# Patient Record
Sex: Male | Born: 2005 | Race: Black or African American | Hispanic: No | Marital: Single | Smoking: Never smoker
Health system: Southern US, Community
[De-identification: ages and names within clinical notes are randomized; demographics above are authoritative.]

## PROBLEM LIST (undated history)

## (undated) DIAGNOSIS — R7303 Prediabetes: Secondary | ICD-10-CM

## (undated) HISTORY — DX: Prediabetes: R73.03

---
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
## 2024-03-19 ENCOUNTER — Ambulatory Visit: Admitting: Physician Assistant

## 2024-03-19 ENCOUNTER — Encounter: Payer: Self-pay | Admitting: Physician Assistant

## 2024-03-19 VITALS — BP 137/90 | HR 114 | Temp 97.6°F | Resp 18 | Ht 75.25 in | Wt 370.0 lb

## 2024-03-19 DIAGNOSIS — R7303 Prediabetes: Secondary | ICD-10-CM

## 2024-03-19 DIAGNOSIS — Z23 Encounter for immunization: Secondary | ICD-10-CM

## 2024-03-19 DIAGNOSIS — Z6841 Body Mass Index (BMI) 40.0 and over, adult: Secondary | ICD-10-CM | POA: Diagnosis not present

## 2024-03-19 DIAGNOSIS — R61 Generalized hyperhidrosis: Secondary | ICD-10-CM

## 2024-03-19 DIAGNOSIS — I10 Essential (primary) hypertension: Secondary | ICD-10-CM | POA: Diagnosis not present

## 2024-03-19 MED ORDER — AMLODIPINE BESYLATE 2.5 MG PO TABS: 2.5000 mg | ORAL_TABLET | Freq: Every day | ORAL | 3 refills | Status: AC

## 2024-03-19 NOTE — Assessment & Plan Note (Addendum)
-   Weight gain to 370 lbs, considerable gain reported in the past couple of years.  - Advised weight loss through healthy diet and exercise. - Discussed benefits of weight loss on joint pain, hypertension, and overall health. - Screening labs obtained.

## 2024-03-19 NOTE — Assessment & Plan Note (Signed)
-   Expectant management - Can consider topical glycopyrrolate such as Qbrexza in the future if it becomes increasingly problematic, however it would be off-label and risk blurred vision and other side effects due to anticholinergic effects

## 2024-03-19 NOTE — Patient Instructions (Addendum)
" °  VISIT SUMMARY: Today, you came in to discuss your excessive sweating and to establish care. We also reviewed your history of hypertension, asthma, prediabetes, and recent weight gain. You were accompanied by your grandmother, Juanita.  YOUR PLAN: -ESSENTIAL HYPERTENSION: Essential hypertension means you have high blood pressure. It is important to manage this to prevent damage to your kidneys and other organs. We started you on amlodipine  2.5 mg daily and advised you to monitor your blood pressure at home. We also discussed lifestyle changes such as a low sodium diet and weight loss. We will recheck your blood pressure in two months and have ordered some lab tests to monitor your health.  -OBESITY: Obesity means having excess body weight. This can affect your joints, blood pressure, and overall health. We discussed the importance of weight loss through diet and exercise to improve your health and reduce joint pain and hypertension.  -PREDIABETES: Prediabetes means your blood sugar levels are higher than normal but not high enough to be classified as diabetes. We discussed monitoring your blood sugar levels and making dietary changes to prevent the progression to diabetes. We have ordered an A1c test to assess your current status.  -NEED FOR INFLUENZA VACCINATION: You were due for your influenza vaccine, which helps protect you from the flu. We administered the influenza vaccine today.  -NEED FOR MENINGOCOCCAL VACCINATION: You were due for the meningococcal B vaccine, which helps protect against meningitis. We administered the meningococcal B vaccine today.  INSTRUCTIONS: Please monitor your blood pressure at home and follow the lifestyle changes we discussed. We will recheck your blood pressure in two months. Additionally, please complete the lab tests we ordered: lipid profile, A1c, CBC, and thyroid  function tests.    Contains text generated by [employer].   "

## 2024-03-19 NOTE — Assessment & Plan Note (Addendum)
-   Newly diagnosed hypertension with BP 176/85 mmHg, likely exacerbated by anxiety.  - Re-check at the end of the visit showed improvement to 137/90 mmHg. - Extensive family history reported. Discussed the risks of poorly-controlled hypertension. - Advised home BP monitoring. - Discussed lifestyle modifications: low sodium diet, weight loss. - Will start amlodipine  2.5 mg daily and follow-up in 6 weeks. - Ordered screening labs: lipid profile, A1c, CBC, thyroid  function tests.

## 2024-03-19 NOTE — Progress Notes (Signed)
 "  Complete physical exam  Patient: Jacob Torres   DOB: [birth year]   18 y.o. Male  MRN: [id number]  Subjective:    Chief Complaint  Patient presents with   Establish Care   Excessive Sweating    Jacob Torres is a 19 y.o. male who presents today for a complete physical exam. He reports consuming a general diet.   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  History of Present Illness Jacob Torres is a 19 year old male with hyperhidrosis and hypertension who presents for sweating and to establish care. He is accompanied by his grandmother, Juanita.  Excessive sweating - Particularly when exposed to heat or after physical activity - Sweating primarily affects the face - Persistent for the past couple of years  Hypertension - Elevated blood pressure first noted during a dental visit, which prevented dental cleaning - No prior antihypertensive medication use - Family history of hypertension in mother and grandfather  Asthma - Uses albuterol  inhaler as needed, primarily during extreme physical activity when younger - Inhaler use is rare, less than once monthly  Prediabetes - Prediabetes identified by pediatrician - Family history of diabetes in grandmother  Postoperative status - acl repair - History of ACL repair in 2023 - No ongoing issues related to surgery  Weight gain and physical activity - Reports considerable weight gain since starting college - Attributed to decreased physical activity after stopping football     Most recent fall risk assessment:    03/19/2024    3:19 PM   Fall Risk   Falls in the past year? 0  Number falls in past yr: 0  Injury with Fall? 0  Risk for fall due to : No Fall Risks  Follow up Falls evaluation completed     Most recent depression screenings:    03/19/2024    3:19 PM  PHQ 2/9 Scores  PHQ - 2 Score 0            Patient Care Team: Landy Honora LITTIE DEVONNA as PCP - General (Physician Assistant)   Show/hide medication list[1]  ROS See HPI, all else negative.       Objective:     BP (!) 137/90   Pulse (!) 114   Temp 97.6 F (36.4 C)   Resp 18   Ht 6' 3.25 (1.911 m)   Wt (!) 370 lb (167.8 kg)   SpO2 100%   BMI 45.94 kg/m    Physical Exam Constitutional:      Appearance: Normal appearance.  HENT:     Head: Normocephalic and atraumatic.     Right Ear: Tympanic membrane normal.     Left Ear: Tympanic membrane normal.     Mouth/Throat:     Pharynx: Oropharynx is clear.  Eyes:     Extraocular Movements: Extraocular movements intact.     Pupils: Pupils are equal, round, and reactive to light.  Cardiovascular:     Rate and Rhythm: Normal rate and regular rhythm.     Pulses: Normal pulses.  Pulmonary:     Effort: Pulmonary effort is normal. No respiratory distress.     Breath sounds:  Normal breath sounds.  Abdominal:     General: There is no distension.     Palpations: Abdomen is soft.     Tenderness: There is no abdominal tenderness.  Genitourinary:    Comments: Deferred Musculoskeletal:        General: Normal range of motion.     Cervical back: Normal range of motion.  Skin:    General: Skin is warm.  Neurological:     General: No focal deficit present.     Mental Status: He is alert and oriented to person, place, and time.  Psychiatric:        Behavior: Behavior normal.      No results found for any visits on 03/19/24.     Assessment & Plan:    Routine Health Maintenance and Physical Exam Discussed health promotion and safety including diet and exercise recommendations,  dental health, and injury prevention. Tobacco cessation if applicable. Seat belts, sunscreen, smoke detectors, etc.    Immunization History  Administered Date(s) Administered   Dtap, Unspecified 08/19/2004, 10/22/2004, 12/27/2004, 09/13/2005, 06/11/2008   HIB, Unspecified 08/19/2004, 10/24/2004, 12/27/2004, 09/13/2005   HPV 9-valent 09/05/2021, 01/06/2022, 05/22/2022   Hep A, Unspecified 09/20/2005, 08/30/2006   Hep B, Unspecified 08/19/2004, 10/24/2004, 12/27/2004   Influenza, Seasonal, Injecte, Preservative Fre 01/18/2009, 12/11/2011, 03/19/2024   Influenza,inj,Quad PF,6+ Mos 03/06/2014, 11/07/2021   Influenza-Unspecified 03/28/2005, 03/06/2006, 03/23/2008   MMR 09/20/2005, 06/11/2008   MenQuadfi_Meningococcal Groups ACYW Conjugate 09/05/2021   Meningococcal B, OMV 03/19/2024   Meningococcal Mcv4o 10/12/2015   PFIZER(Purple Top)SARS-COV-2 Vaccination 07/18/2019, 08/08/2019   Pneumococcal-Unspecified 08/19/2004, 10/24/2004, 12/27/2004, 09/20/2005   Polio, Unspecified 08/19/2004, 10/24/2004, 12/27/2004, 06/11/2008   Tdap 10/12/2015   Varicella 03/06/2006, 06/11/2008    Health Maintenance  Topic Date Due   HIV Screening  Never done   Hepatitis C Screening  Never done   COVID-19 Vaccine (4 - 2025-26 season) 10/29/2023   Meningococcal B Vaccine (2 of 2 - Bexsero SCDM 2-dose series) 09/16/2024   DTaP/Tdap/Td (7 - Td or Tdap) 10/11/2025   Influenza Vaccine  Completed   HPV VACCINES  Completed   Pneumococcal Vaccine  Aged Out        Problem List Items Addressed This Visit       Cardiovascular and Mediastinum   Essential hypertension, benign   - Newly diagnosed hypertension with BP 176/85 mmHg, likely exacerbated by anxiety.  - Re-check at the end of the visit showed improvement to 137/90 mmHg. - Extensive family history reported. Discussed the risks of poorly-controlled hypertension. - Advised home BP monitoring. - Discussed lifestyle modifications: low sodium diet, weight  loss. - Will start amlodipine  2.5 mg daily and follow-up in 6 weeks. - Ordered screening labs: lipid profile, A1c, CBC, thyroid  function tests.      Relevant Medications   amLODipine  (NORVASC ) 2.5 MG tablet   Other Relevant Orders   CBC with Differential/Platelet   Comprehensive metabolic panel with GFR   Hemoglobin A1c   Lipid panel   TSH     Musculoskeletal and Integument   Craniofacial hyperhidrosis   - Expectant management - Can consider topical glycopyrrolate such as Qbrexza in the future if it becomes increasingly problematic, however it would be off-label and risk blurred vision and other side effects due to anticholinergic effects        Other   Prediabetes   - Discussed monitoring and lifestyle changes to prevent diabetes progression. - Ordered A1c test to assess status. - Advised dietary modifications for blood sugar management.  Morbid obesity (HCC)   - Weight gain to 370 lbs, considerable gain reported in the past couple of years.  - Advised weight loss through healthy diet and exercise. - Discussed benefits of weight loss on joint pain, hypertension, and overall health. - Screening labs obtained.      Other Visit Diagnoses       Need for influenza vaccination    -  Primary   Relevant Orders   Flu vaccine trivalent PF, 6mos and older(Flulaval,Afluria,Fluarix,Fluzone) (Completed)     Need for meningococcal vaccination       Relevant Orders   Meningococcal B, OMV (Bexsero) (Completed)       Annual preventative health maintenance - Administered influenza vaccine and meningococcal B vaccine as part of routine health maintenance.   Diet: Recommend to adjust caloric intake to maintain or achieve ideal body weight, to reduce intake of dietary saturated fat and total fat, to limit sodium intake by avoiding high sodium foods and not adding table salt, and to maintain adequate dietary potassium and calcium preferably from fresh fruits, vegetables, and low-fat  dairy products.   Emphasized the importance of regular exercise.  Injury prevention: Recommend seatbelts, safety helmets, smoke detector, etc..   Dental health: Recommend regular tooth brushing, flossing, and dental visits.    Return in about 6 weeks (around 04/30/2024) for Follow-Up.     Honora Seip, PA-C     [1]  Outpatient Medications Prior to Visit  Medication Sig   albuterol  (PROVENTIL  HFA;VENTOLIN  HFA) 108 (90 BASE) MCG/ACT inhaler Inhale 2 puffs into the lungs every 6 (six) hours as needed for wheezing.   [DISCONTINUED] albuterol  (PROVENTIL ) (2.5 MG/3ML) 0.083% nebulizer solution Take 2.5 mg by nebulization every 6 (six) hours as needed for wheezing.   [DISCONTINUED] albuterol  (VENTOLIN  HFA) 108 (90 Base) MCG/ACT inhaler Inhale 2 puffs into the lungs every 4 - 6 hours as needed for breathing diificulties   [DISCONTINUED] beclomethasone (QVAR) 80 MCG/ACT inhaler Inhale into the lungs 2 (two) times daily.   [DISCONTINUED] ibuprofen (ADVIL,MOTRIN) 100 MG/5ML suspension Take 5 mg/kg by mouth every 6 (six) hours as needed for fever.   [DISCONTINUED] loratadine (CLARITIN) 5 MG chewable tablet Chew 5 mg by mouth daily.   [DISCONTINUED] montelukast (SINGULAIR) 5 MG chewable tablet Chew 5 mg by mouth at bedtime.     [DISCONTINUED] ondansetron  (ZOFRAN  ODT) 4 MG disintegrating tablet 1 tab sublingual q6h PRN nausea (Patient not taking: Reported on 12/06/2016)   [DISCONTINUED] oxyCODONE -acetaminophen  (PERCOCET/ROXICET) 5-325 MG tablet Take 1 tablet by mouth every 4 (four) hours as needed for up to 7 days for Pain.   [DISCONTINUED] pregabalin  (LYRICA ) 50 MG capsule Take 1 capsule (50 mg total) by mouth 2 times daily for 14 days.   [DISCONTINUED] ranitidine  (ZANTAC ) 150 MG tablet Take 1 tablet (150 mg total) by mouth 2 (two) times daily.   [DISCONTINUED] tiZANidine  (ZANAFLEX ) 2 MG tablet Take 1 tablet (2 mg total) by mouth every 8 (eight) hours as needed for up to 14 days.   No  facility-administered medications prior to visit.   "

## 2024-03-19 NOTE — Assessment & Plan Note (Signed)
-   Discussed monitoring and lifestyle changes to prevent diabetes progression. - Ordered A1c test to assess status. - Advised dietary modifications for blood sugar management.

## 2024-03-20 ENCOUNTER — Ambulatory Visit: Payer: Self-pay | Admitting: Physician Assistant

## 2024-03-20 DIAGNOSIS — Z833 Family history of diabetes mellitus: Secondary | ICD-10-CM

## 2024-03-20 DIAGNOSIS — R7303 Prediabetes: Secondary | ICD-10-CM

## 2024-03-20 LAB — COMPREHENSIVE METABOLIC PANEL WITH GFR
ALT: 75 U/L — ABNORMAL HIGH (ref 3–53)
AST: 40 U/L — ABNORMAL HIGH (ref 5–37)
Albumin: 4.7 g/dL (ref 3.5–5.2)
Alkaline Phosphatase: 91 U/L (ref 52–171)
BUN: 11 mg/dL (ref 6–23)
CO2: 27 meq/L (ref 19–32)
Calcium: 10.7 mg/dL — ABNORMAL HIGH (ref 8.4–10.5)
Chloride: 99 meq/L (ref 96–112)
Creatinine, Ser: 1.27 mg/dL (ref 0.40–1.50)
GFR: 81.74 mL/min
Glucose, Bld: 95 mg/dL (ref 70–99)
Potassium: 4.7 meq/L (ref 3.5–5.1)
Sodium: 137 meq/L (ref 135–145)
Total Bilirubin: 0.4 mg/dL (ref 0.2–1.2)
Total Protein: 8 g/dL (ref 6.0–8.3)

## 2024-03-20 LAB — CBC WITH DIFFERENTIAL/PLATELET
Basophils Absolute: 0.1 K/uL (ref 0.0–0.1)
Basophils Relative: 1 % (ref 0.0–3.0)
Eosinophils Absolute: 0 K/uL (ref 0.0–0.7)
Eosinophils Relative: 0.3 % (ref 0.0–5.0)
HCT: 44.7 % (ref 36.0–49.0)
Hemoglobin: 14.8 g/dL (ref 12.0–16.0)
Lymphocytes Relative: 37.2 % (ref 24.0–48.0)
Lymphs Abs: 2.7 K/uL (ref 0.7–4.0)
MCHC: 33.1 g/dL (ref 31.0–37.0)
MCV: 83.4 fl (ref 78.0–98.0)
Monocytes Absolute: 0.4 K/uL (ref 0.1–1.0)
Monocytes Relative: 5.8 % (ref 3.0–12.0)
Neutro Abs: 4 K/uL (ref 1.4–7.7)
Neutrophils Relative %: 55.7 % (ref 43.0–71.0)
Platelets: 424 K/uL (ref 150.0–575.0)
RBC: 5.36 Mil/uL (ref 3.80–5.70)
RDW: 14.3 % (ref 11.4–15.5)
WBC: 7.2 K/uL (ref 4.5–13.5)

## 2024-03-20 LAB — LIPID PANEL
Cholesterol: 207 mg/dL — ABNORMAL HIGH (ref 28–200)
HDL: 39.8 mg/dL
LDL Cholesterol: 104 mg/dL — ABNORMAL HIGH (ref 10–99)
NonHDL: 166.97
Total CHOL/HDL Ratio: 5
Triglycerides: 313 mg/dL — ABNORMAL HIGH (ref 10.0–149.0)
VLDL: 62.6 mg/dL — ABNORMAL HIGH (ref 0.0–40.0)

## 2024-03-20 LAB — HEMOGLOBIN A1C: Hgb A1c MFr Bld: 6.4 % (ref 4.6–6.5)

## 2024-03-20 LAB — TSH: TSH: 1.54 u[IU]/mL (ref 0.40–5.00)

## 2024-04-10 ENCOUNTER — Encounter: Admitting: Dietician

## 2024-05-14 ENCOUNTER — Ambulatory Visit: Admitting: Physician Assistant
